# Patient Record
Sex: Male | Born: 1990 | Race: Black or African American | Hispanic: No | Marital: Single | Smoking: Never smoker
Health system: Southern US, Community
[De-identification: ages and names within clinical notes are randomized; demographics above are authoritative.]

---

## 2011-10-24 ENCOUNTER — Emergency Department (HOSPITAL_BASED_OUTPATIENT_CLINIC_OR_DEPARTMENT_OTHER): Payer: No Typology Code available for payment source

## 2011-10-24 ENCOUNTER — Encounter (HOSPITAL_BASED_OUTPATIENT_CLINIC_OR_DEPARTMENT_OTHER): Payer: Self-pay

## 2011-10-24 ENCOUNTER — Emergency Department (HOSPITAL_BASED_OUTPATIENT_CLINIC_OR_DEPARTMENT_OTHER)
Admission: EM | Admit: 2011-10-24 | Discharge: 2011-10-24 | Disposition: A | Discharge status: Home / Self Care | Payer: No Typology Code available for payment source | Attending: Emergency Medicine | Admitting: Emergency Medicine

## 2011-10-24 DIAGNOSIS — R079 Chest pain, unspecified: Secondary | ICD-10-CM | POA: Insufficient documentation

## 2011-10-24 DIAGNOSIS — S161XXA Strain of muscle, fascia and tendon at neck level, initial encounter: Secondary | ICD-10-CM

## 2011-10-24 DIAGNOSIS — M542 Cervicalgia: Secondary | ICD-10-CM | POA: Insufficient documentation

## 2011-10-24 DIAGNOSIS — Y92410 Unspecified street and highway as the place of occurrence of the external cause: Secondary | ICD-10-CM | POA: Insufficient documentation

## 2011-10-24 MED ORDER — IBUPROFEN 800 MG PO TABS: 800.0000 mg | ORAL_TABLET | Freq: Three times a day (TID) | ORAL | Status: AC
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
## 2011-10-24 NOTE — Discharge Instructions (Signed)
Cervical Strain Care After A cervical strain is when the muscles and ligaments in your neck have been stretched. The bones are not broken. If you had any problems moving your arms or legs immediately after the injury, even if the problem has gone away, make sure to tell this to your caregiver.  HOME CARE INSTRUCTIONS   While awake, apply ice packs to the neck or areas of pain about every 1 to 2 hours, for 15 to 20 minutes at a time. Do this for 2 days. If you were given a cervical collar for support, ask your caregiver if you may remove it for bathing or applying ice.   If given a cervical collar, wear as instructed. Do not remove any collar unless instructed by a caregiver.   Only take over-the-counter or prescription medicines for pain, discomfort, or fever as directed by your caregiver.  Recheck with the hospital or clinic after a radiologist has read your X-rays. Recheck with the hospital or clinic to make sure the initial readings are correct. Do this also to determine if you need further studies. It is your responsibility to find out your X-ray results. X-rays are sometimes repeated in one week to ten days. These are often repeated to make sure that a hairline fracture was not overlooked. Ask your caregiver how you are to find out about your radiology (X-ray) results. SEEK IMMEDIATE MEDICAL CARE IF:   You have increasing pain in your neck.   You develop difficulties swallowing or breathing.   You have numbness, weakness, or movement problems in the arms or legs.   You have difficulty walking.   You develop bowel or bladder retention or incontinence.   You have problems with walking.  MAKE SURE YOU:   Understand these instructions.   Will watch your condition.   Will get help right away if you are not doing well or get worse.  Document Released: 04/20/2005 Document Revised: 12/31/2010 Document Reviewed: 12/02/2007 [employer] Patient Information 2012 [employer]. 
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
## 2014-01-31 ENCOUNTER — Emergency Department (HOSPITAL_BASED_OUTPATIENT_CLINIC_OR_DEPARTMENT_OTHER)
Admission: EM | Admit: 2014-01-31 | Discharge: 2014-01-31 | Disposition: A | Discharge status: Home / Self Care | Payer: Managed Care, Other (non HMO) | Attending: Emergency Medicine | Admitting: Emergency Medicine

## 2014-01-31 ENCOUNTER — Encounter (HOSPITAL_BASED_OUTPATIENT_CLINIC_OR_DEPARTMENT_OTHER): Payer: Self-pay | Admitting: Emergency Medicine

## 2014-01-31 DIAGNOSIS — R059 Cough, unspecified: Secondary | ICD-10-CM | POA: Insufficient documentation

## 2014-01-31 DIAGNOSIS — R109 Unspecified abdominal pain: Secondary | ICD-10-CM

## 2014-01-31 DIAGNOSIS — R05 Cough: Secondary | ICD-10-CM | POA: Diagnosis not present

## 2014-01-31 DIAGNOSIS — R197 Diarrhea, unspecified: Secondary | ICD-10-CM | POA: Insufficient documentation

## 2014-01-31 DIAGNOSIS — R1013 Epigastric pain: Secondary | ICD-10-CM | POA: Diagnosis present

## 2014-01-31 LAB — COMPREHENSIVE METABOLIC PANEL
ALT: 19 U/L (ref 0–53)
AST: 22 U/L (ref 0–37)
Albumin: 3.9 g/dL (ref 3.5–5.2)
Alkaline Phosphatase: 49 U/L (ref 39–117)
Anion gap: 12 (ref 5–15)
BUN: 9 mg/dL (ref 6–23)
CALCIUM: 9.3 mg/dL (ref 8.4–10.5)
CO2: 26 meq/L (ref 19–32)
CREATININE: 0.8 mg/dL (ref 0.50–1.35)
Chloride: 101 mEq/L (ref 96–112)
GLUCOSE: 94 mg/dL (ref 70–99)
Potassium: 3.8 mEq/L (ref 3.7–5.3)
Sodium: 139 mEq/L (ref 137–147)
Total Bilirubin: 0.3 mg/dL (ref 0.3–1.2)
Total Protein: 7.9 g/dL (ref 6.0–8.3)

## 2014-01-31 LAB — CBC WITH DIFFERENTIAL/PLATELET
Basophils Absolute: 0 10*3/uL (ref 0.0–0.1)
Basophils Relative: 0 % (ref 0–1)
EOS PCT: 2 % (ref 0–5)
Eosinophils Absolute: 0.1 10*3/uL (ref 0.0–0.7)
HEMATOCRIT: 42.8 % (ref 39.0–52.0)
Hemoglobin: 14.5 g/dL (ref 13.0–17.0)
LYMPHS ABS: 1.4 10*3/uL (ref 0.7–4.0)
LYMPHS PCT: 20 % (ref 12–46)
MCH: 30.4 pg (ref 26.0–34.0)
MCHC: 33.9 g/dL (ref 30.0–36.0)
MCV: 89.7 fL (ref 78.0–100.0)
MONO ABS: 0.7 10*3/uL (ref 0.1–1.0)
MONOS PCT: 9 % (ref 3–12)
Neutro Abs: 4.8 10*3/uL (ref 1.7–7.7)
Neutrophils Relative %: 69 % (ref 43–77)
Platelets: 214 10*3/uL (ref 150–400)
RBC: 4.77 MIL/uL (ref 4.22–5.81)
RDW: 13.6 % (ref 11.5–15.5)
WBC: 6.9 10*3/uL (ref 4.0–10.5)

## 2014-01-31 LAB — LIPASE, BLOOD: LIPASE: 19 U/L (ref 11–59)

## 2014-01-31 MED ORDER — FAMOTIDINE 20 MG PO TABS
20.0000 mg | ORAL_TABLET | Freq: Once | ORAL | Status: AC
  Administered 2014-01-31: 20 mg via ORAL
  Filled 2014-01-31: qty 1

## 2014-01-31 MED ORDER — FAMOTIDINE 20 MG PO TABS: 20.0000 mg | ORAL_TABLET | Freq: Two times a day (BID) | ORAL | Status: AC

## 2014-01-31 NOTE — Discharge Instructions (Signed)

## 2014-01-31 NOTE — ED Notes (Signed)
Pt c/o diffuse abdominal pain x 1 month. Sts 1 episode of vomiting. Reports regular BM.

## 2014-01-31 NOTE — ED Provider Notes (Signed)
CSN: [id number]     Arrival date & time 01/31/14  0725 History   First MD Initiated Contact with Patient 01/31/14 [phone]     Chief Complaint  Patient presents with  . Abdominal Pain      Patient is a 23 y.o. male presenting with abdominal pain. The history is provided by the patient.  Abdominal Pain Pain location:  Epigastric Pain quality: burning   Pain radiates to:  LLQ and RLQ Pain severity:  Moderate Onset quality:  Gradual Duration:  30 days Timing:  Intermittent Progression:  Worsening Chronicity:  New Relieved by:  Nothing Worsened by:  Eating Associated symptoms: cough, diarrhea and vomiting   Associated symptoms: no dysuria, no fever and no melena   Associated symptoms comment:  1 episode of vomiting and 2 episodes of nonbloody diarrhea in the past day Risk factors: no alcohol abuse and no NSAID use     PMH - none Surg hx - none History  Substance Use Topics  . Smoking status: Never Smoker   . Smokeless tobacco: Not on file  . Alcohol Use: Yes    Review of Systems  Constitutional: Negative for fever.  Respiratory: Positive for cough.   Gastrointestinal: Positive for vomiting, abdominal pain and diarrhea. Negative for melena.  Genitourinary: Negative for dysuria, frequency, discharge and testicular pain.  Neurological: Negative for weakness.  All other systems reviewed and are negative.     Allergies  Review of patient's allergies indicates no known allergies.  Home Medications   Prior to Admission medications   Not on File   BP 139/88  Pulse 96  Temp(Src) 98.4 F (36.9 C) (Oral)  Resp 14  Ht 6' (1.829 m)  Wt 170 lb (77.111 kg)  BMI 23.05 kg/m2  SpO2 100% Physical Exam CONSTITUTIONAL: Well developed/well nourished HEAD: Normocephalic/atraumatic EYES: EOMI/PERRL, no icterus ENMT: Mucous membranes moist NECK: supple no meningeal signs SPINE:entire spine nontender CV: S1/S2 noted, no murmurs/rubs/gallops noted LUNGS: Lungs are clear to  auscultation bilaterally, no apparent distress ABDOMEN: soft, nontender, no rebound or guarding GU:no cva tenderness NEURO: Pt is awake/alert, moves all extremitiesx4 EXTREMITIES: pulses normal, full ROM SKIN: warm, color normal PSYCH: no abnormalities of mood noted  ED Course  Procedures Labs Review Labs Reviewed  CBC WITH DIFFERENTIAL  COMPREHENSIVE METABOLIC PANEL  LIPASE, BLOOD     MDM   Final diagnoses:  Abdominal pain, unspecified abdominal location    Nursing notes including past medical history and social history reviewed and considered in documentation Labs/vital reviewed and considered     Joya Gaskinsonald W Gwenna Fuston, MD 01/31/14 [phone]

## 2017-11-18 ENCOUNTER — Emergency Department (HOSPITAL_BASED_OUTPATIENT_CLINIC_OR_DEPARTMENT_OTHER)
Admission: EM | Admit: 2017-11-18 | Discharge: 2017-11-18 | Disposition: A | Discharge status: Home / Self Care | Payer: Managed Care, Other (non HMO) | Attending: Emergency Medicine | Admitting: Emergency Medicine

## 2017-11-18 ENCOUNTER — Encounter (HOSPITAL_BASED_OUTPATIENT_CLINIC_OR_DEPARTMENT_OTHER): Payer: Self-pay

## 2017-11-18 ENCOUNTER — Other Ambulatory Visit: Payer: Self-pay

## 2017-11-18 DIAGNOSIS — K1370 Unspecified lesions of oral mucosa: Secondary | ICD-10-CM | POA: Insufficient documentation

## 2017-11-18 DIAGNOSIS — N489 Disorder of penis, unspecified: Secondary | ICD-10-CM

## 2017-11-18 DIAGNOSIS — N4889 Other specified disorders of penis: Secondary | ICD-10-CM | POA: Insufficient documentation

## 2017-11-18 MED ORDER — VALACYCLOVIR HCL 1 G PO TABS: 1000.0000 mg | ORAL_TABLET | Freq: Three times a day (TID) | ORAL | 1 refills | Status: AC

## 2017-11-18 NOTE — ED Provider Notes (Signed)
[facility] EMERGENCY DEPARTMENT Provider Note   CSN: [id number] Arrival date & time: 11/18/17  1557     History   Chief Complaint Chief Complaint  Patient presents with  . Rash    HPI Caleb Cohen is a 27 y.o. male who presents emergency department with chief complaint of lesion on his penis and mouth.  Patient states that both of them started about 3 days ago.  He states that the one on his penis with somewhat itchy he has never had anything like this before.  He denies penile discharge.  HPI  History reviewed. No pertinent past medical history.  There are no active problems to display for this patient.   History reviewed. No pertinent surgical history.      Home Medications    Prior to Admission medications   Medication Sig Start Date End Date Taking? Authorizing Provider  famotidine (PEPCID) 20 MG tablet Take 1 tablet (20 mg total) by mouth 2 (two) times daily. 01/31/14   Zadie Rhine, MD    Family History No family history on file.  Social History Social History   Tobacco Use  . Smoking status: Never Smoker  . Smokeless tobacco: Never Used  Substance Use Topics  . Alcohol use: Yes    Comment: occ  . Drug use: Yes    Types: Marijuana     Allergies   Patient has no known allergies.   Review of Systems Review of Systems Ten systems reviewed and are negative for acute change, except as noted in the HPI.    Physical Exam Updated Vital Signs BP 120/85 (BP Location: Left Arm)   Pulse 79   Temp 99 F (37.2 C) (Oral)   Resp 18   Ht 6' (1.829 m)   Wt 86.6 kg (191 lb)   SpO2 99%   BMI 25.90 kg/m   Physical Exam  Constitutional: He is oriented to person, place, and time. He appears well-developed and well-nourished. No distress.  HENT:  Head: Normocephalic and atraumatic.  Shallow ulceration at the corner of the mouth on the R side  Eyes: Pupils are equal, round, and reactive to light. Conjunctivae and EOM are normal. No scleral  icterus.  Neck: Normal range of motion. Neck supple.  Cardiovascular: Normal rate, regular rhythm and normal heart sounds.  Pulmonary/Chest: Effort normal and breath sounds normal. No respiratory distress.  Abdominal: Soft. There is no tenderness.  Genitourinary:  Genitourinary Comments: Normal male anatomy, circumcised penis, small pustule on the shaft of the penis  Musculoskeletal: He exhibits no edema.  Neurological: He is alert and oriented to person, place, and time.  Skin: Skin is warm and dry. He is not diaphoretic.  Psychiatric: His behavior is normal.  Nursing note and vitals reviewed.    ED Treatments / Results  Labs (all labs ordered are listed, but only abnormal results are displayed) Labs Reviewed - No data to display  EKG None  Radiology No results found.  Procedures Procedures (including critical care time)  Medications Ordered in ED Medications - No data to display   Initial Impression / Assessment and Plan / ED Course  I have reviewed the triage vital signs and the nursing notes.  Pertinent labs & imaging results that were available during my care of the patient were reviewed by me and considered in my medical decision making (see chart for details).     Patient with lesion on the penis and by the lateral side of the mouth.  No discharge,  no testicular tenderness or other lesions.  Patient will be treated for genital herpes although cannot positively make this diagnosis.  Patient given follow-up with the health department for further testing.  His GC chlamydia HIV and RPR are pending.  Patient appears appropriate for discharge at this time.  Discussed safe sex practices.  Final Clinical Impressions(s) / ED Diagnoses   Final diagnoses:  None    ED Discharge Orders    None       Arthor CaptainHarris, Maleki Hippe, PA-C 11/18/17 Margretta [id number]    Geoffery Lyonselo, [place], MD 11/19/17 2344

## 2017-11-18 NOTE — Discharge Instructions (Signed)
Free HIV and STD Testing °These locations offer FREE confidential testing for HIV, Chlamydia, Gonorrhea, and Syphilis. °Non-Traditional Testing Sites Address Telephone ° °[facility] [address], °[place] °[phone] °Mondays 5pm - 7pm ° °[facility] °[address], [address] °[place] °[phone] °Wednesdays 2pm-8pm ° °[facility] °[address], °[place] °[phone] °Thursdays 9am-12noon °1pm-4pm ° °[facility] °[address], [place] °[phone] °Tuesdays °Thursdays °9am-12noon °1pm-4pm ° °[facility] offers free, confidential testing and treatment for HIV, Chlamydia, Gonorrhea, Syphilis, Herpes, Bacterial Vaginosis, Yeast, and Trichomoniasis. °Traditional Testing ° ° °[facility] °[address], °[place] °[phone]  °Monday thru Friday  °Call for an appointment ° °[facility] [place] °[facility] °[address] °[place] °[phone] °Monday thru Friday  °Call for anappointment. ° °If you have any questions about this information please call [phone]. °03/12/2011 ° °

## 2017-11-18 NOTE — ED Notes (Signed)
Pt verbalized understanding of dc instructions.

## 2017-11-18 NOTE — ED Notes (Signed)
In with PA for assessment
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
## 2017-11-19 LAB — GC/CHLAMYDIA PROBE AMP (~~LOC~~) NOT AT ARMC
Chlamydia: NEGATIVE
NEISSERIA GONORRHEA: NEGATIVE

## 2017-11-19 LAB — RPR: RPR: NONREACTIVE

## 2017-11-19 LAB — HIV ANTIBODY (ROUTINE TESTING W REFLEX): HIV SCREEN 4TH GENERATION: NONREACTIVE
